# Patient Record
Sex: Female | Born: 1990 | Hispanic: Yes | Marital: Single | Smoking: Never smoker
Health system: Southern US, Community
[De-identification: ages and names within clinical notes are randomized; demographics above are authoritative.]

---

## 2017-07-06 ENCOUNTER — Telehealth: Payer: Self-pay | Admitting: Vascular Surgery

## 2017-07-06 NOTE — Telephone Encounter (Signed)
FW: scheduling new VV patient  Received: Yesterday  Message Contents  Micah Flesherankin, Sonya D, RN  Jena Gaussoczniak, Michele A        I think Kathleen Aguirre can be reached at [phone].   Sonya   Previous Messages    ----- Message -----  From: Micah Flesherankin, Sonya D, RN  Sent: 06/30/2017  4:02 PM  To: [facility]  Subject: scheduling new VV patient             Kathleen Aguirre is calling on behalf of her sister who does not speak [place] (Spanish only). Please call Kathleen Aguirre to schedule appointment and ultrasound for her sister, Kathleen Aguirre DOB 09/17[birth year]1991. [name] has VV with pain, right leg. No previous vein treatments or ultrasounds. [name] She need VV (new patient) appointment and venous reflux study (right leg). Chad CordialDebbie Thomas worked up an estimate of cost of visit and ultrasound. Would cost approximately $500. (estimate) Please let sister Kathleen Courser(Sandy Aguirre) know the cost as she asked. Thanks!!!!!!!

## 2017-07-06 NOTE — Telephone Encounter (Signed)
Lm for sister to call back to sch appt.

## 2017-07-13 ENCOUNTER — Other Ambulatory Visit: Payer: Self-pay

## 2017-07-13 ENCOUNTER — Other Ambulatory Visit (HOSPITAL_COMMUNITY): Payer: Self-pay | Admitting: Nurse Practitioner

## 2017-07-13 DIAGNOSIS — I83811 Varicose veins of right lower extremities with pain: Secondary | ICD-10-CM

## 2017-07-13 DIAGNOSIS — R1900 Intra-abdominal and pelvic swelling, mass and lump, unspecified site: Secondary | ICD-10-CM

## 2017-07-13 DIAGNOSIS — N921 Excessive and frequent menstruation with irregular cycle: Secondary | ICD-10-CM

## 2017-07-16 ENCOUNTER — Ambulatory Visit (HOSPITAL_COMMUNITY)
Admission: RE | Admit: 2017-07-16 | Discharge: 2017-07-16 | Disposition: A | Discharge status: Home / Self Care | Payer: Self-pay | Source: Ambulatory Visit | Attending: Nurse Practitioner | Admitting: Nurse Practitioner

## 2017-07-16 DIAGNOSIS — N921 Excessive and frequent menstruation with irregular cycle: Secondary | ICD-10-CM

## 2017-07-16 DIAGNOSIS — N920 Excessive and frequent menstruation with regular cycle: Secondary | ICD-10-CM | POA: Insufficient documentation

## 2017-07-16 DIAGNOSIS — R1900 Intra-abdominal and pelvic swelling, mass and lump, unspecified site: Secondary | ICD-10-CM | POA: Insufficient documentation

## 2017-07-21 ENCOUNTER — Encounter: Payer: Self-pay | Admitting: Surgery

## 2017-07-21 ENCOUNTER — Encounter (HOSPITAL_COMMUNITY): Payer: Self-pay

## 2017-08-05 ENCOUNTER — Ambulatory Visit (HOSPITAL_COMMUNITY)
Admission: RE | Admit: 2017-08-05 | Discharge: 2017-08-05 | Disposition: A | Discharge status: Home / Self Care | Payer: Self-pay | Source: Ambulatory Visit | Attending: Vascular Surgery | Admitting: Vascular Surgery

## 2017-08-05 ENCOUNTER — Ambulatory Visit (INDEPENDENT_AMBULATORY_CARE_PROVIDER_SITE_OTHER): Payer: Self-pay | Admitting: Vascular Surgery

## 2017-08-05 ENCOUNTER — Encounter: Payer: Self-pay | Admitting: Vascular Surgery

## 2017-08-05 ENCOUNTER — Other Ambulatory Visit: Payer: Self-pay

## 2017-08-05 VITALS — BP 103/66 | HR 80 | Resp 18 | Ht 68.0 in | Wt 227.0 lb

## 2017-08-05 DIAGNOSIS — I83811 Varicose veins of right lower extremities with pain: Secondary | ICD-10-CM | POA: Insufficient documentation

## 2017-08-05 NOTE — Progress Notes (Signed)
Patient name: Kathleen Aguirre MRN: [id number] DOB: 1990/10/25 Sex: female  REASON FOR CONSULT:    Painful varicose veins right lower extremity.  She is a self-referral.  HPI:   [name] is a pleasant 27 y.o. female, who presents with painful varicose veins of the right lower extremity.  She has 1 child who is 29 years old and she began noticing varicose veins in the right lower extremity after that pregnancy.  She has had a gradual enlargement of the varicose veins along the medial aspect of her right thigh.  She experiences significant pain in her varicose veins with aching pain and heaviness.  The symptoms have been gradually progressive.  Her symptoms are aggravated by standing.  Her symptoms are relieved with elevation.  She has not worn compression stockings.  She typically does not elevate her legs daily.  She has not used ibuprofen.  She denies any previous history of DVT.  She did have an episode of phlebitis in her right thigh in June 2018.  History reviewed. No pertinent past medical history.  History reviewed. No pertinent family history.  FAMILY HISTORY: She is unaware of any family history of varicose veins.  SOCIAL HISTORY: She is not a smoker. Social History   Socioeconomic History  . Marital status: Single    Spouse name: Not on file  . Number of children: Not on file  . Years of education: Not on file  . Highest education level: Not on file  Occupational History  . Not on file  Social Needs  . Financial resource strain: Not on file  . Food insecurity:    Worry: Not on file    Inability: Not on file  . Transportation needs:    Medical: Not on file    Non-medical: Not on file  Tobacco Use  . Smoking status: Never Smoker  . Smokeless tobacco: Never Used  Substance and Sexual Activity  . Alcohol use: Never    Frequency: Never  . Drug use: Never  . Sexual activity: Not on file  Lifestyle  . Physical activity:    Days per week: Not on file    Minutes per session: Not on file  . Stress: Not on file  Relationships  . Social connections:    Talks on phone: Not on file    Gets together: Not on file    Attends religious service: Not on file    Active member of club or organization: Not on file    Attends meetings of clubs or organizations: Not on file    Relationship status: Not on file  . Intimate partner violence:    Fear of current or ex partner: Not on file    Emotionally abused: Not on file    Physically abused: Not on file    Forced sexual activity: Not on file  Other Topics Concern  . Not on file  Social History Narrative  . Not on file    Not on File  Current Outpatient Medications  Medication Sig Dispense Refill  . Ascorbic Acid (VITAMIN C PO) Take by mouth.    . Omega-3 Fatty Acids (FISH OIL PO) Take by mouth.     No current facility-administered medications for this visit.     REVIEW OF SYSTEMS:   denotes positive finding,  denotes negative finding Cardiac  Comments:  Chest pain or chest pressure:    Shortness of breath upon exertion:    Short of breath when lying flat:  Irregular heart rhythm:        Vascular    Pain in calf, thigh, or hip brought on by ambulation:    Pain in feet at night that wakes you up from your sleep:  x   Blood clot in your veins: x   Leg swelling:  x       Pulmonary    Oxygen at home:    Productive cough:     Wheezing:         Neurologic    Sudden weakness in arms or legs:     Sudden numbness in arms or legs:     Sudden onset of difficulty speaking or slurred speech:    Temporary loss of vision in one eye:     Problems with dizziness:         Gastrointestinal    Blood in stool:     Vomited blood:         Genitourinary    Burning when urinating:     Blood in urine:        Psychiatric    Major depression:         Hematologic    Bleeding problems:    Problems with blood clotting too easily:        Skin    Rashes or ulcers:          Constitutional    Fever or chills:     PHYSICAL EXAM:   Vitals:   08/05/17 1437  BP: 103/66  Pulse: 80  Resp: 18  SpO2: 99%  Weight: 227 lb (103 kg)  Height:  (1.727 m)    GENERAL: The patient is a well-nourished female, in no acute distress. The vital signs are documented above. CARDIAC: There is a regular rate and rhythm.  VASCULAR: I do not detect carotid bruits. I cannot palpate pedal pulses however she has biphasic Doppler signals in both feet. She has moderate bilateral lower extremity swelling.  She has some large truncal varicosities along the medial aspect of her right thigh and right calf.  These appear to be under pressure. She has no significant hyperpigmentation. PULMONARY: There is good air exchange bilaterally without wheezing or rales. ABDOMEN: Soft and non-tender with normal pitched bowel sounds.  MUSCULOSKELETAL: There are no major deformities or cyanosis. NEUROLOGIC: No focal weakness or paresthesias are detected. SKIN: There are no ulcers or rashes noted. PSYCHIATRIC: The patient has a normal affect.  DATA:    RIGHT LOWER EXTREMITY VENOUS DUPLEX: I have independently interpreted her right lower extremity venous duplex study.  There is no evidence of deep venous thrombosis or superficial venous thrombosis on the right.  There is deep venous reflux on the right involving the common femoral vein and popliteal vein.  There is superficial venous reflux on the right involving the great saphenous vein.  The right great saphenous vein in the thigh is significantly dilated.  MEDICAL ISSUES:   PAINFUL VARICOSE VEINS RIGHT LOWER EXTREMITY: This patient has significant deep venous reflux and also superficial venous reflux involving the right great saphenous vein which is significantly dilated.  This feeds a large cluster varicose veins along the medial aspect of her right thigh and medial right calf.  I have discussed with her the importance of intermittent leg  elevation and the proper positioning for this.  I have written her a prescription for thigh-high compression stockings with a gradient of 20 to 30 mmHg.  I have encouraged her to use ibuprofen as  needed for pain.  We discussed the importance of exercise and trying to avoid prolonged sitting and standing.  If her symptoms do not improve with these conservative measures that I think she could be considered for laser ablation of the right great saphenous vein with stab phlebectomies.  The history today was obtained through the translator.  Waverly Ferrari [facility] [phone]

## 2017-08-10 ENCOUNTER — Other Ambulatory Visit: Payer: Self-pay | Admitting: *Deleted

## 2017-08-10 DIAGNOSIS — I83811 Varicose veins of right lower extremities with pain: Secondary | ICD-10-CM

## 2017-08-12 ENCOUNTER — Ambulatory Visit: Payer: Self-pay | Admitting: Vascular Surgery

## 2017-09-09 ENCOUNTER — Other Ambulatory Visit: Payer: Self-pay | Admitting: Vascular Surgery

## 2017-09-13 ENCOUNTER — Encounter: Payer: Self-pay | Admitting: Vascular Surgery

## 2017-09-13 ENCOUNTER — Ambulatory Visit (INDEPENDENT_AMBULATORY_CARE_PROVIDER_SITE_OTHER): Payer: Self-pay | Admitting: Vascular Surgery

## 2017-09-13 VITALS — BP 120/78 | HR 72 | Temp 97.6°F | Resp 16 | Ht 68.0 in | Wt 224.0 lb

## 2017-09-13 DIAGNOSIS — I83811 Varicose veins of right lower extremities with pain: Secondary | ICD-10-CM

## 2017-09-13 NOTE — Progress Notes (Signed)
     Laser Ablation Procedure    Date: 09/13/2017   Kathleen RabonCarmen Aurora ZOXWRUZuniga DOB:[birth year]  Consent signed: Yes    Surgeon:  Dr. Tawanna Coolerodd Yaphet Smethurst  Procedure: Laser Ablation: right Greater Saphenous Vein  BP 120/78   Pulse 72   Temp 97.6 F (36.4 C)   Resp 16   Ht 5\' 8"  (1.727 m)   Wt 224 lb (101.6 kg)   SpO2 98%   BMI 34.06 kg/m   Tumescent Anesthesia: 480 cc 0.9% NaCl with 50 cc Lidocaine HCL with 1% Epi and 15 cc 8.4% NaHCO3  Local Anesthesia: 4 cc Lidocaine HCL and NaHCO3 (ratio 2:1)  15 watts continuous mode        Total energy: 3271   Total time: 3:38    Stab Phlebectomy: 10-20 Sites: Thigh and Calf  Patient tolerated procedure well  Notes:   Description of Procedure:  After marking the course of the secondary varicosities, the patient was placed on the operating table in the supine position, and the right leg was prepped and draped in sterile fashion.   Local anesthetic was administered and under ultrasound guidance the saphenous vein was accessed with a micro needle and guide wire; then the mirco puncture sheath was placed.  A guide wire was inserted saphenofemoral junction , followed by a 5 french sheath.  The position of the sheath and then the laser fiber below the junction was confirmed using the ultrasound.  Tumescent anesthesia was administered along the course of the saphenous vein using ultrasound guidance. The patient was placed in Trendelenburg position and protective laser glasses were placed on patient and staff, and the laser was fired at 15 watts continuous mode advancing 1-652mm/second for a total of 3271 joules.   For stab phlebectomies, local anesthetic was administered at the previously marked varicosities, and tumescent anesthesia was administered around the vessels.  Ten to 20 stab wounds were made using the tip of an 11 blade. And using the vein hook, the phlebectomies were performed using a hemostat to avulse the varicosities.  Adequate hemostasis was  achieved.     Steri strips were applied to the stab wounds and ABD pads and thigh high compression stockings were applied.  Ace wrap bandages were applied over the phlebectomy sites and at the top of the saphenofemoral junction. Blood loss was less than 15 cc.  The patient ambulated out of the operating room having tolerated the procedure well.  Uneventful ablation from mid calf to below the saphenofemoral junction and phlebectomy of multiple varicosities in her medial thigh and medial calf

## 2017-09-14 ENCOUNTER — Ambulatory Visit: Payer: Self-pay | Admitting: Vascular Surgery

## 2017-09-14 ENCOUNTER — Encounter (HOSPITAL_COMMUNITY): Payer: Self-pay

## 2017-09-15 ENCOUNTER — Encounter: Payer: Self-pay | Admitting: Vascular Surgery

## 2017-09-20 ENCOUNTER — Ambulatory Visit (INDEPENDENT_AMBULATORY_CARE_PROVIDER_SITE_OTHER): Payer: Self-pay | Admitting: Vascular Surgery

## 2017-09-20 ENCOUNTER — Ambulatory Visit (HOSPITAL_COMMUNITY)
Admission: RE | Admit: 2017-09-20 | Discharge: 2017-09-20 | Disposition: A | Discharge status: Home / Self Care | Payer: Self-pay | Source: Ambulatory Visit | Attending: Surgery | Admitting: Surgery

## 2017-09-20 ENCOUNTER — Encounter (HOSPITAL_COMMUNITY): Payer: Self-pay

## 2017-09-20 ENCOUNTER — Encounter: Payer: Self-pay | Admitting: Vascular Surgery

## 2017-09-20 VITALS — BP 122/73 | HR 66 | Temp 97.8°F | Resp 16 | Ht 68.0 in | Wt 224.0 lb

## 2017-09-20 DIAGNOSIS — I83811 Varicose veins of right lower extremities with pain: Secondary | ICD-10-CM

## 2017-09-20 DIAGNOSIS — Z48812 Encounter for surgical aftercare following surgery on the circulatory system: Secondary | ICD-10-CM

## 2017-09-20 NOTE — Progress Notes (Signed)
   Patient name: Kathleen GougeCarmen Aurora Chovan MRN: [id number] DOB: Oct 08, 1990 Sex: female  REASON FOR VISIT:   Follow-up after endovenous laser ablation of the right great saphenous vein  HPI:   Kathleen Aguirre is a pleasant 27 y.o. female who underwent endovenous laser ablation of the right great saphenous vein 1 week ago by Dr. Tawanna Coolerodd Early.  She comes in for a follow-up visit.  She has some mild aching pain in the medial right leg.  She has gradually resumed her normal activities.  The pain has been gradually improving.  Her symptoms are aggravated by standing and relieved with elevation.  Current Outpatient Medications  Medication Sig Dispense Refill  . Ascorbic Acid (VITAMIN C PO) Take by mouth.    . Omega-3 Fatty Acids (FISH OIL PO) Take by mouth.     No current facility-administered medications for this visit.     REVIEW OF SYSTEMS:  [X]  denotes positive finding, [ ]  denotes negative finding Cardiac  Comments:  Chest pain or chest pressure:    Shortness of breath upon exertion:    Short of breath when lying flat:    Irregular heart rhythm:    Constitutional    Fever or chills:     PHYSICAL EXAM:   Vitals:   09/20/17 0930  BP: 122/73  Pulse: 66  Resp: 16  Temp: 97.8 F (36.6 C)  SpO2: 98%  Weight: 224 lb (101.6 kg)  Height: 5\' 8"  (1.727 m)    GENERAL: The patient is a well-nourished female, in no acute distress. The vital signs are documented above. CARDIOVASCULAR: There is a regular rate and rhythm. PULMONARY: There is good air exchange bilaterally without wheezing or rales. VASCULAR: She has some mild bruising on the medial right thigh and knee where she underwent endovenous laser ablation. She has mild right lower extremity swelling.  She does have some mild scattered spider veins in both legs.  DATA:   VENOUS DUPLEX: I have independently interpreted her venous duplex scan today.  There is no evidence of DVT in the right lower extremity.  The great saphenous vein  was successfully closed up to 2.2 cm from the saphenofemoral junction.  MEDICAL ISSUES:   STATUS POST ENDOVENOUS LASER ABLATION OF RIGHT GREAT SAPHENOUS VEIN: The patient is doing well status post endovenous laser ablation of the right great saphenous vein.  She has had no complications.  I have had a long discussion with her about venous disease through her translator.  She understands that this is a chronic disease and we have discussed some simple lifestyle changes to help stay on top of her venous disease.  In addition she will return for sclerotherapy of several areas in both legs with scattered spider veins.  Otherwise we will see her back as needed.  Waverly Ferrarihristopher Dickson [facility] [phone]

## 2017-11-03 ENCOUNTER — Ambulatory Visit (INDEPENDENT_AMBULATORY_CARE_PROVIDER_SITE_OTHER): Payer: Self-pay

## 2017-11-03 DIAGNOSIS — I8393 Asymptomatic varicose veins of bilateral lower extremities: Secondary | ICD-10-CM

## 2017-11-03 DIAGNOSIS — I868 Varicose veins of other specified sites: Secondary | ICD-10-CM

## 2017-11-03 NOTE — Progress Notes (Signed)
0.3% Sotradecol administered with a 27g butterfly.  Patient received a total of 18cc.  Treated a combination of both spider veins and reticular veins on back of bilateral legs. Pt. Tolerated well. Anticipate good results. Will follow PRN.  Photos: No.  Compression stockings applied: Yes.

## 2019-06-04 IMAGING — US US PELVIS COMPLETE TRANSABD/TRANSVAG
1 series · 13 of 25 positions shown · non-contrast
Comparison: None

CLINICAL DATA: Initial evaluation for menorrhagia, right-sided
pelvic fullness on physical exam.



[Series 1: us pelvis complete transabd/transvag · 0.22mm/px · 13 of 81 slices shown]
[im 1/81]
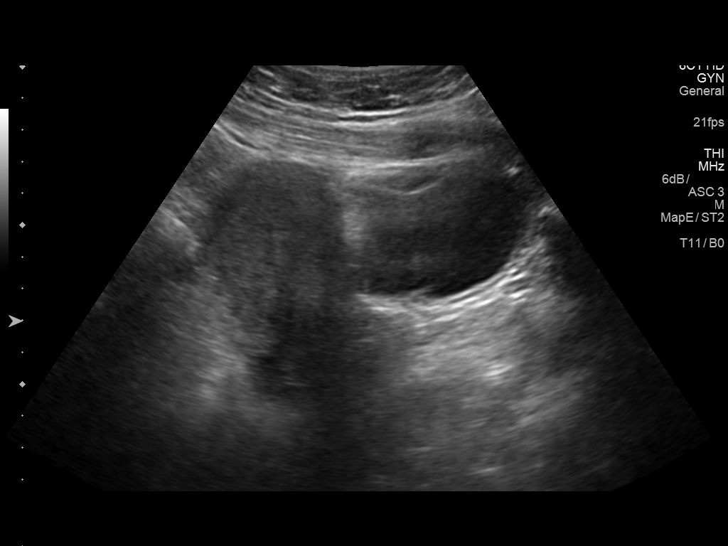
[im 7/81]
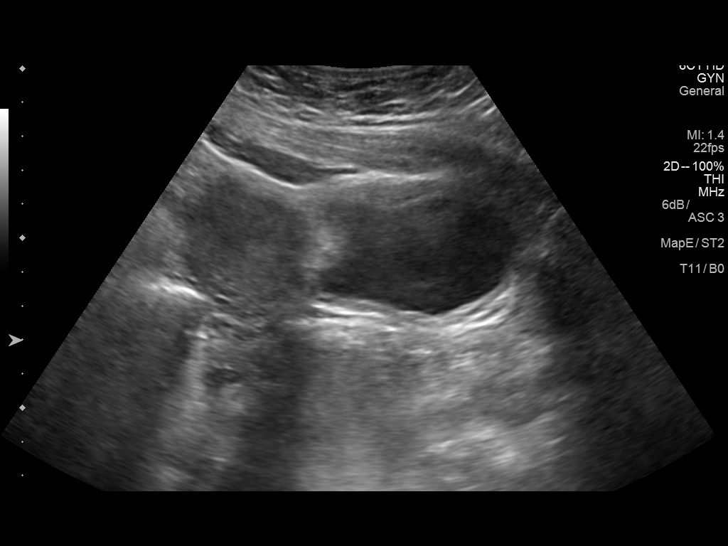
[im 14/81]
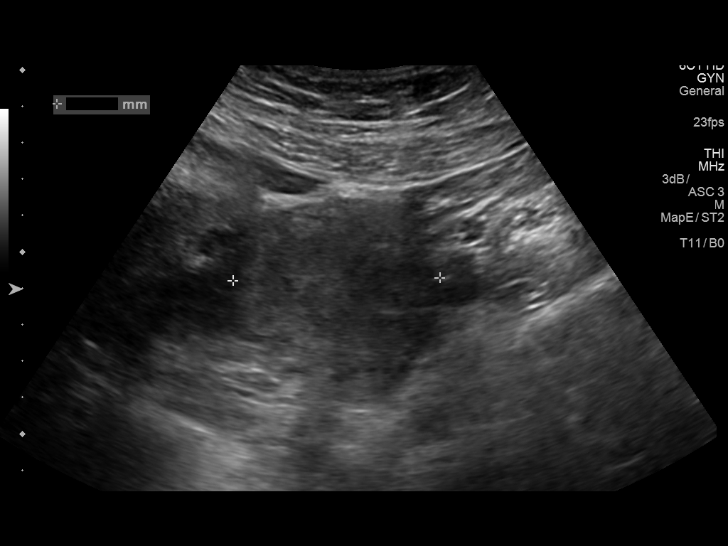
[im 21/81]
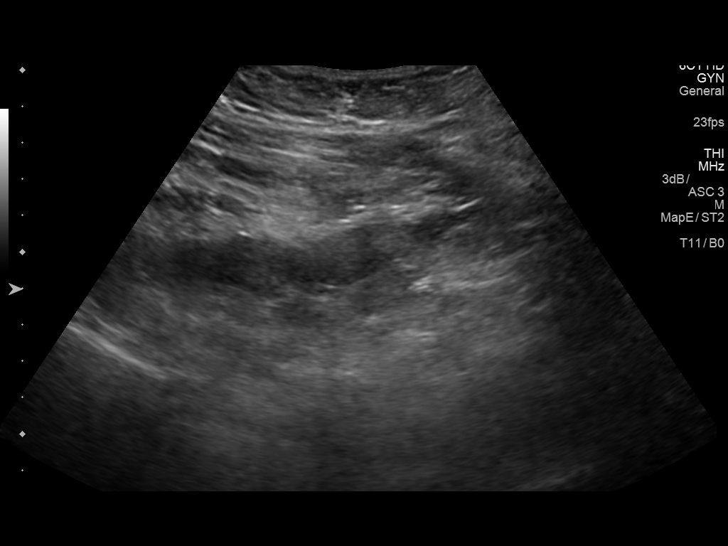
[im 27/81]
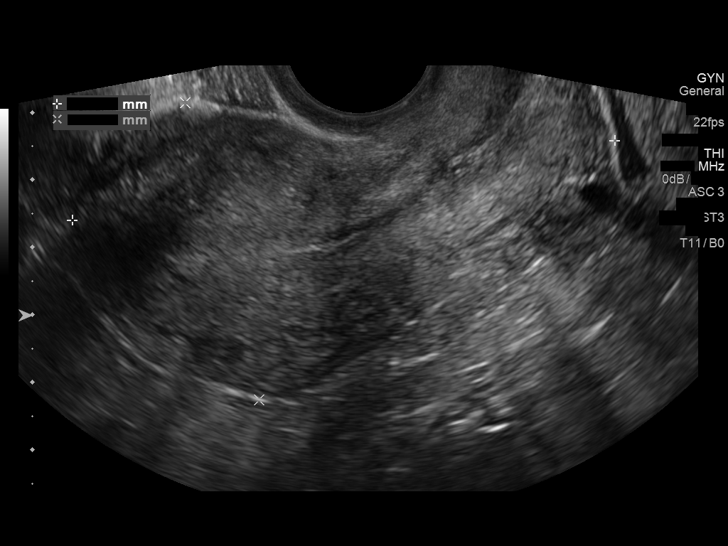
[im 34/81]
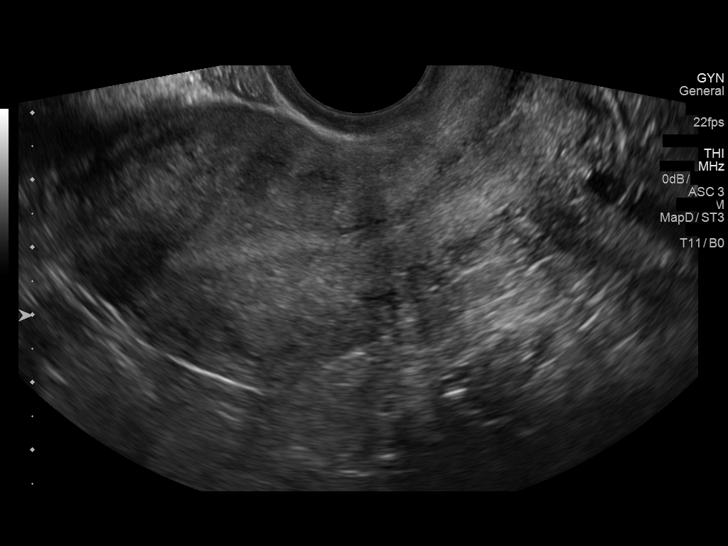
[im 41/81]
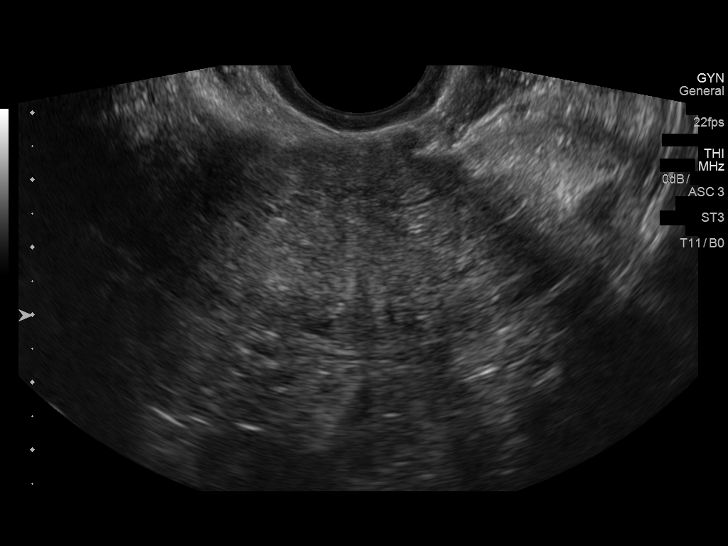
[im 47/81]
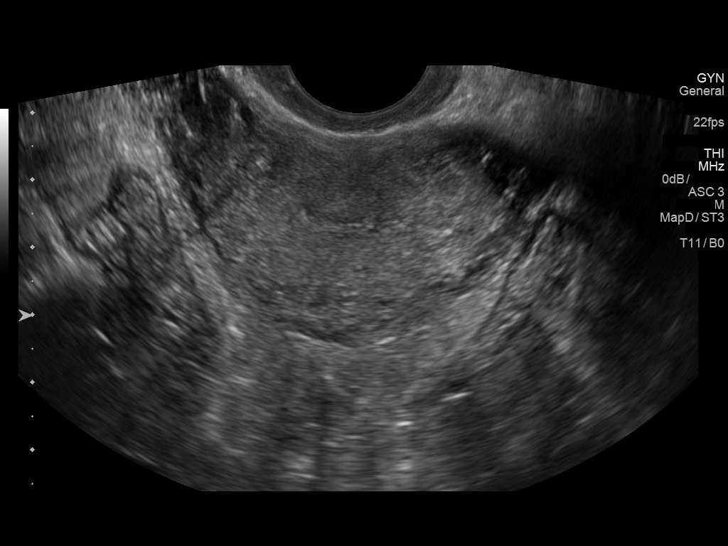
[im 54/81]
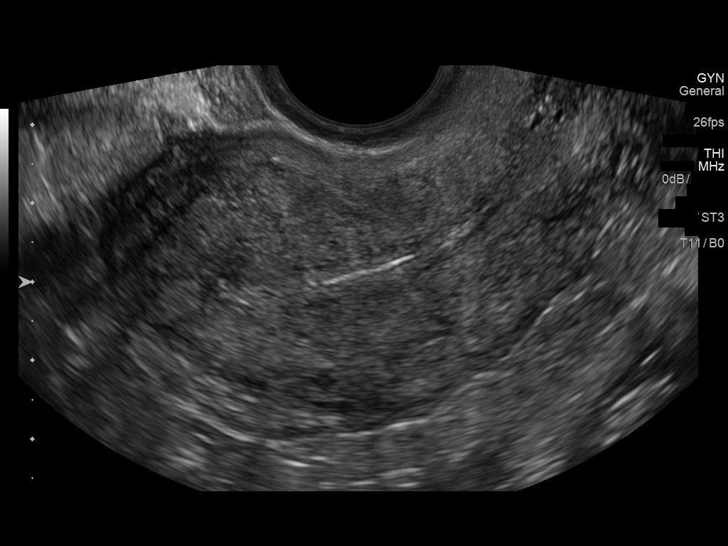
[im 61/81]
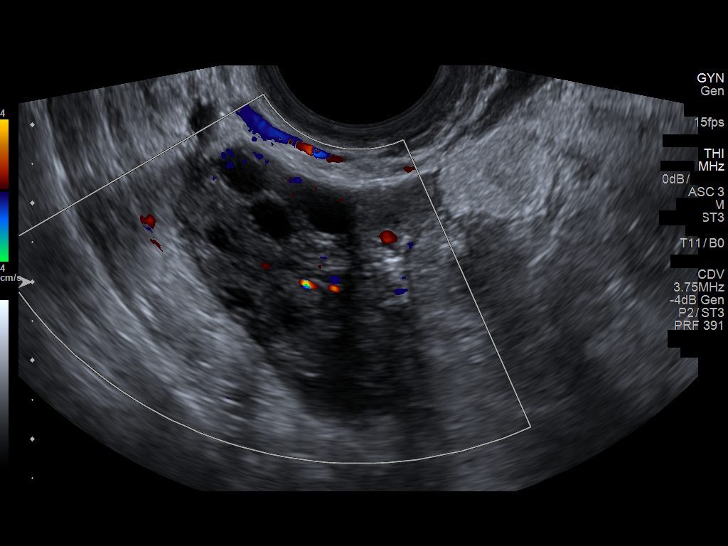
[im 67/81]
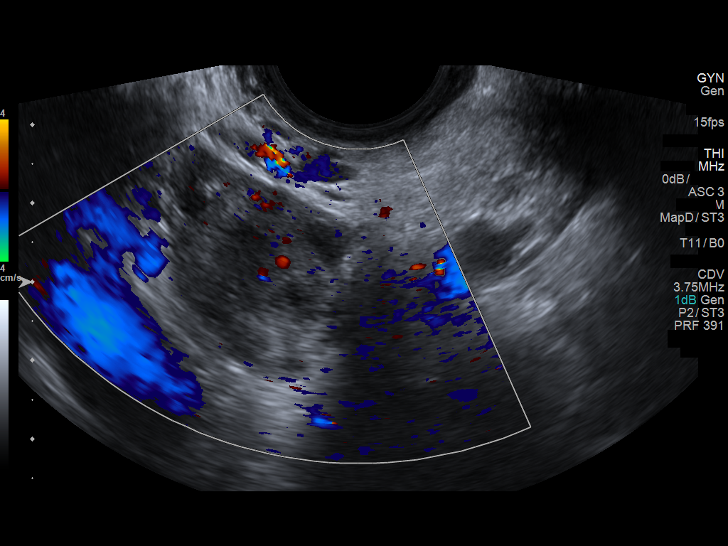
[im 74/81]
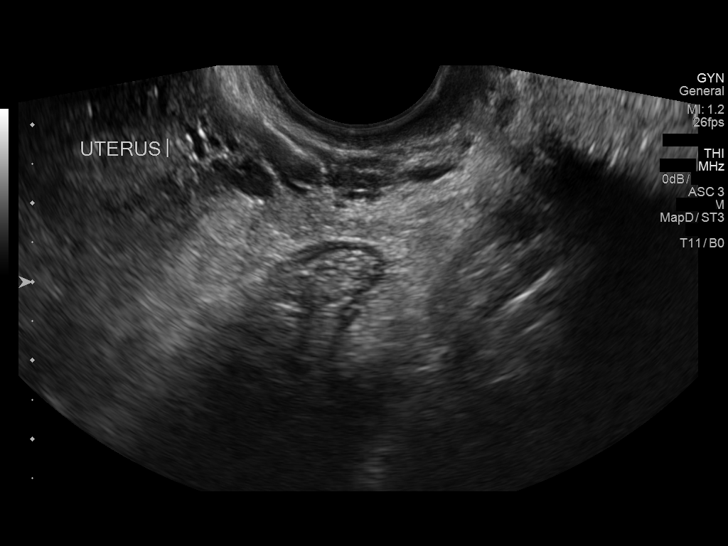
[im 81/81]
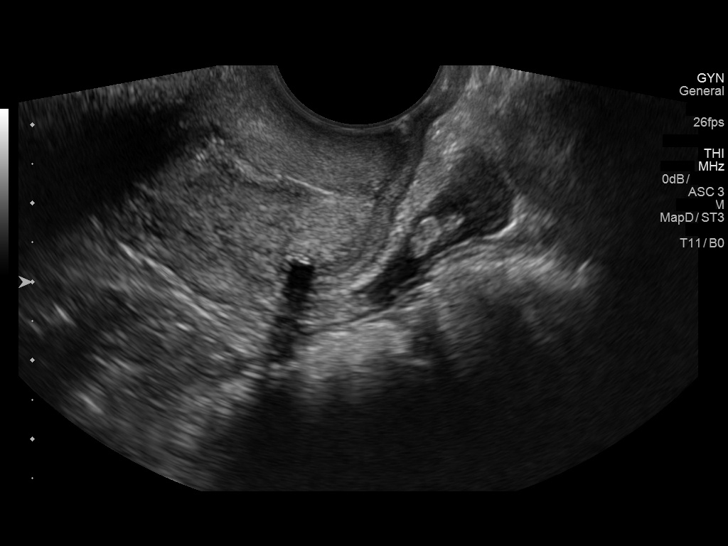

[13 of 25 positions shown; findings below may reference images not displayed]

FINDINGS: Uterus

Measurements: 8.1 x 4.5 x 6.0 cm. No fibroids or other mass
visualized. Nuva ring noted within the vaginal vault.

Endometrium

Thickness: 4.9 mm.  No focal abnormality visualized.

Right ovary

Measurements: 4.6 x 2.3 x 2.3 cm. Normal appearance/no adnexal mass.

Left ovary

Not visualized.  No adnexal mass.

Other findings

Small volume free fluid within the pelvis.
IMPRESSION: 1. Endometrial stripe within normal limits measuring 4.9 mm without
focal abnormality. If bleeding remains unresponsive to hormonal or
medical therapy, sonohysterogram should be considered for focal
lesion work-up. (Ref: Radiological Reasoning: Algorithmic Workup of
Abnormal Vaginal Bleeding with Endovaginal Sonography and
Sonohysterography. AJR 3992; 191:S68-73).
2. Normal sonographic appearance of the uterus.
3. Normal right ovary. No pelvic or right-sided adnexal mass to
correspond with findings on physical exam.
4. Nonvisualization of the left ovary.  No left-sided adnexal mass.
